# Patient Record
Sex: Male | Born: 1970 | Race: White | Hispanic: No | Marital: Married | Smoking: Former smoker
Health system: Southern US, Community
[De-identification: ages and names within clinical notes are randomized; demographics above are authoritative.]

## PROBLEM LIST (undated history)

## (undated) DIAGNOSIS — M109 Gout, unspecified: Secondary | ICD-10-CM

## (undated) DIAGNOSIS — K5792 Diverticulitis of intestine, part unspecified, without perforation or abscess without bleeding: Secondary | ICD-10-CM

## (undated) DIAGNOSIS — I10 Essential (primary) hypertension: Secondary | ICD-10-CM

## (undated) DIAGNOSIS — E119 Type 2 diabetes mellitus without complications: Secondary | ICD-10-CM

## (undated) DIAGNOSIS — K922 Gastrointestinal hemorrhage, unspecified: Secondary | ICD-10-CM

## (undated) HISTORY — PX: SHOULDER SURGERY: SHX246

## (undated) HISTORY — PX: JOINT REPLACEMENT: SHX530

---

## 2015-03-26 ENCOUNTER — Ambulatory Visit
Admission: EM | Admit: 2015-03-26 | Discharge: 2015-03-26 | Payer: Self-pay | Attending: Family Medicine | Admitting: Family Medicine

## 2015-03-26 ENCOUNTER — Encounter: Payer: Self-pay | Admitting: *Deleted

## 2015-03-26 ENCOUNTER — Inpatient Hospital Stay
Admission: EM | Admit: 2015-03-26 | Discharge: 2015-03-29 | DRG: 378 | Disposition: A | Discharge status: Home / Self Care | Payer: Self-pay | Attending: Internal Medicine | Admitting: Internal Medicine

## 2015-03-26 DIAGNOSIS — K922 Gastrointestinal hemorrhage, unspecified: Secondary | ICD-10-CM | POA: Diagnosis present

## 2015-03-26 DIAGNOSIS — Z79899 Other long term (current) drug therapy: Secondary | ICD-10-CM

## 2015-03-26 DIAGNOSIS — Z87891 Personal history of nicotine dependence: Secondary | ICD-10-CM

## 2015-03-26 DIAGNOSIS — D500 Iron deficiency anemia secondary to blood loss (chronic): Secondary | ICD-10-CM

## 2015-03-26 DIAGNOSIS — Z8719 Personal history of other diseases of the digestive system: Secondary | ICD-10-CM

## 2015-03-26 DIAGNOSIS — K625 Hemorrhage of anus and rectum: Secondary | ICD-10-CM

## 2015-03-26 DIAGNOSIS — D62 Acute posthemorrhagic anemia: Secondary | ICD-10-CM | POA: Diagnosis present

## 2015-03-26 DIAGNOSIS — E119 Type 2 diabetes mellitus without complications: Secondary | ICD-10-CM | POA: Diagnosis present

## 2015-03-26 DIAGNOSIS — I10 Essential (primary) hypertension: Secondary | ICD-10-CM | POA: Diagnosis present

## 2015-03-26 DIAGNOSIS — Z888 Allergy status to other drugs, medicaments and biological substances status: Secondary | ICD-10-CM

## 2015-03-26 DIAGNOSIS — K529 Noninfective gastroenteritis and colitis, unspecified: Secondary | ICD-10-CM | POA: Diagnosis present

## 2015-03-26 DIAGNOSIS — R55 Syncope and collapse: Secondary | ICD-10-CM

## 2015-03-26 DIAGNOSIS — K5791 Diverticulosis of intestine, part unspecified, without perforation or abscess with bleeding: Principal | ICD-10-CM | POA: Diagnosis present

## 2015-03-26 DIAGNOSIS — M109 Gout, unspecified: Secondary | ICD-10-CM | POA: Diagnosis present

## 2015-03-26 DIAGNOSIS — Z9660 Presence of unspecified orthopedic joint implant: Secondary | ICD-10-CM | POA: Diagnosis present

## 2015-03-26 DIAGNOSIS — R42 Dizziness and giddiness: Secondary | ICD-10-CM

## 2015-03-26 HISTORY — DX: Gout, unspecified: M10.9

## 2015-03-26 HISTORY — DX: Essential (primary) hypertension: I10

## 2015-03-26 HISTORY — DX: Gastrointestinal hemorrhage, unspecified: K92.2

## 2015-03-26 HISTORY — DX: Diverticulitis of intestine, part unspecified, without perforation or abscess without bleeding: K57.92

## 2015-03-26 HISTORY — DX: Type 2 diabetes mellitus without complications: E11.9

## 2015-03-26 LAB — CBC
HCT: 28.7 % — ABNORMAL LOW (ref 40.0–52.0)
Hemoglobin: 9.5 g/dL — ABNORMAL LOW (ref 13.0–18.0)
MCH: 26.1 pg (ref 26.0–34.0)
MCHC: 33.2 g/dL (ref 32.0–36.0)
MCV: 78.7 fL — ABNORMAL LOW (ref 80.0–100.0)
PLATELETS: 258 10*3/uL (ref 150–440)
RBC: 3.65 MIL/uL — ABNORMAL LOW (ref 4.40–5.90)
RDW: 13.9 % (ref 11.5–14.5)
WBC: 12.1 10*3/uL — AB (ref 3.8–10.6)

## 2015-03-26 LAB — CBC WITH DIFFERENTIAL/PLATELET
Basophils Absolute: 0.1 10*3/uL (ref 0–0.1)
Basophils Relative: 1 %
EOS PCT: 0 %
Eosinophils Absolute: 0 10*3/uL (ref 0–0.7)
HCT: 29 % — ABNORMAL LOW (ref 40.0–52.0)
Hemoglobin: 9.6 g/dL — ABNORMAL LOW (ref 13.0–18.0)
LYMPHS ABS: 2.2 10*3/uL (ref 1.0–3.6)
LYMPHS PCT: 18 %
MCH: 26.1 pg (ref 26.0–34.0)
MCHC: 33.2 g/dL (ref 32.0–36.0)
MCV: 78.5 fL — AB (ref 80.0–100.0)
MONO ABS: 0.8 10*3/uL (ref 0.2–1.0)
MONOS PCT: 7 %
Neutro Abs: 9.4 10*3/uL — ABNORMAL HIGH (ref 1.4–6.5)
Neutrophils Relative %: 74 %
PLATELETS: 267 10*3/uL (ref 150–440)
RBC: 3.69 MIL/uL — AB (ref 4.40–5.90)
RDW: 13.9 % (ref 11.5–14.5)
WBC: 12.5 10*3/uL — AB (ref 3.8–10.6)

## 2015-03-26 LAB — GLUCOSE, CAPILLARY
Glucose-Capillary: 264 mg/dL — ABNORMAL HIGH (ref 65–99)
Glucose-Capillary: 269 mg/dL — ABNORMAL HIGH (ref 65–99)

## 2015-03-26 LAB — COMPREHENSIVE METABOLIC PANEL
ALT: 27 U/L (ref 17–63)
ANION GAP: 10 (ref 5–15)
AST: 29 U/L (ref 15–41)
Albumin: 3.6 g/dL (ref 3.5–5.0)
Alkaline Phosphatase: 54 U/L (ref 38–126)
BUN: 17 mg/dL (ref 6–20)
CALCIUM: 8.3 mg/dL — AB (ref 8.9–10.3)
CHLORIDE: 99 mmol/L — AB (ref 101–111)
CO2: 22 mmol/L (ref 22–32)
CREATININE: 1.06 mg/dL (ref 0.61–1.24)
Glucose, Bld: 345 mg/dL — ABNORMAL HIGH (ref 65–99)
Potassium: 4.4 mmol/L (ref 3.5–5.1)
Sodium: 131 mmol/L — ABNORMAL LOW (ref 135–145)
Total Bilirubin: 0.3 mg/dL (ref 0.3–1.2)
Total Protein: 6.2 g/dL — ABNORMAL LOW (ref 6.5–8.1)

## 2015-03-26 LAB — ABO/RH: ABO/RH(D): A POS

## 2015-03-26 LAB — PREPARE RBC (CROSSMATCH)

## 2015-03-26 MED ORDER — ONDANSETRON HCL 4 MG PO TABS: 4.0000 mg | ORAL_TABLET | Freq: Four times a day (QID) | ORAL | Status: DC | PRN

## 2015-03-26 MED ORDER — INSULIN ASPART 100 UNIT/ML ~~LOC~~ SOLN
0.0000 [IU] | Freq: Three times a day (TID) | SUBCUTANEOUS | Status: DC
  Administered 2015-03-27 (×2): 2 [IU] via SUBCUTANEOUS
  Administered 2015-03-27: 3 [IU] via SUBCUTANEOUS
  Administered 2015-03-28 (×3): 2 [IU] via SUBCUTANEOUS
  Administered 2015-03-29 (×2): 3 [IU] via SUBCUTANEOUS
  Filled 2015-03-26 (×2): qty 2
  Filled 2015-03-26 (×2): qty 3
  Filled 2015-03-26 (×2): qty 2
  Filled 2015-03-26: qty 3
  Filled 2015-03-26: qty 2

## 2015-03-26 MED ORDER — SODIUM CHLORIDE 0.9 % IV SOLN
Freq: Once | INTRAVENOUS | Status: AC
  Administered 2015-03-26: 19:00:00 via INTRAVENOUS

## 2015-03-26 MED ORDER — ACETAMINOPHEN 325 MG PO TABS
650.0000 mg | ORAL_TABLET | Freq: Four times a day (QID) | ORAL | Status: DC | PRN
  Administered 2015-03-28: 650 mg via ORAL
  Filled 2015-03-26: qty 2

## 2015-03-26 MED ORDER — SODIUM CHLORIDE 0.9 % IJ SOLN
3.0000 mL | Freq: Two times a day (BID) | INTRAMUSCULAR | Status: DC
  Administered 2015-03-26 – 2015-03-27 (×2): 3 mL via INTRAVENOUS

## 2015-03-26 MED ORDER — METOPROLOL TARTRATE 50 MG PO TABS
50.0000 mg | ORAL_TABLET | Freq: Two times a day (BID) | ORAL | Status: DC
  Administered 2015-03-26 – 2015-03-27 (×2): 50 mg via ORAL
  Filled 2015-03-26 (×3): qty 1

## 2015-03-26 MED ORDER — PANTOPRAZOLE SODIUM 40 MG IV SOLR
40.0000 mg | Freq: Once | INTRAVENOUS | Status: AC
  Administered 2015-03-26: 40 mg via INTRAVENOUS
  Filled 2015-03-26: qty 40

## 2015-03-26 MED ORDER — SODIUM CHLORIDE 0.9 % IV SOLN: Freq: Once | INTRAVENOUS | Status: DC

## 2015-03-26 MED ORDER — PANTOPRAZOLE SODIUM 40 MG IV SOLR
80.0000 mg | Freq: Once | INTRAVENOUS | Status: AC
  Administered 2015-03-26: 80 mg via INTRAVENOUS
  Filled 2015-03-26: qty 80

## 2015-03-26 MED ORDER — ACETAMINOPHEN 650 MG RE SUPP
650.0000 mg | Freq: Four times a day (QID) | RECTAL | Status: DC | PRN
Start: 2015-03-26 — End: 2015-03-29

## 2015-03-26 MED ORDER — OXYCODONE HCL 5 MG PO TABS
5.0000 mg | ORAL_TABLET | ORAL | Status: DC | PRN
  Administered 2015-03-26 – 2015-03-28 (×7): 5 mg via ORAL
  Filled 2015-03-26 (×8): qty 1

## 2015-03-26 MED ORDER — SODIUM CHLORIDE 0.9 % IV SOLN
8.0000 mg/h | INTRAVENOUS | Status: DC
  Administered 2015-03-26 – 2015-03-28 (×4): 8 mg/h via INTRAVENOUS
  Filled 2015-03-26 (×4): qty 80

## 2015-03-26 MED ORDER — CITALOPRAM HYDROBROMIDE 20 MG PO TABS
40.0000 mg | ORAL_TABLET | Freq: Every day | ORAL | Status: DC
  Administered 2015-03-27 – 2015-03-28 (×2): 40 mg via ORAL
  Filled 2015-03-26 (×4): qty 2

## 2015-03-26 MED ORDER — ONDANSETRON HCL 4 MG/2ML IJ SOLN
4.0000 mg | Freq: Four times a day (QID) | INTRAMUSCULAR | Status: DC | PRN
  Administered 2015-03-26: 4 mg via INTRAVENOUS
  Filled 2015-03-26: qty 2

## 2015-03-26 MED ORDER — ALLOPURINOL 300 MG PO TABS
300.0000 mg | ORAL_TABLET | Freq: Every day | ORAL | Status: DC
  Administered 2015-03-26 – 2015-03-29 (×4): 300 mg via ORAL
  Filled 2015-03-26 (×5): qty 1

## 2015-03-26 MED ORDER — PANTOPRAZOLE SODIUM 40 MG IV SOLR
40.0000 mg | Freq: Two times a day (BID) | INTRAVENOUS | Status: DC
Start: 2015-03-30 — End: 2015-03-28

## 2015-03-26 MED ORDER — ZOLPIDEM TARTRATE 5 MG PO TABS
10.0000 mg | ORAL_TABLET | Freq: Every evening | ORAL | Status: DC | PRN
  Administered 2015-03-26 – 2015-03-28 (×3): 10 mg via ORAL
  Filled 2015-03-26 (×3): qty 2

## 2015-03-26 MED ORDER — SODIUM CHLORIDE 0.9 % IV SOLN
INTRAVENOUS | Status: DC
  Administered 2015-03-26 – 2015-03-28 (×4): via INTRAVENOUS

## 2015-03-26 NOTE — ED Provider Notes (Signed)
[facility] Emergency Department Provider Note    ____________________________________________  Time seen: 1635  I have reviewed the triage vital signs and the nursing notes.   HISTORY  Chief Complaint Rectal Bleeding   History limited by: Not Limited   HPI Timothy Bartlett is a 45 y.o. male with history of GI bleeds who presents to the emergency department today because of concerns for further GI bleeding. The patient states that it started last night. He has had multiple episodes of GI bleeding. He describes it as being severe. He describes bright red color. He states that he has had a large volume. He has associated abdominal pain. Initially was in the left upper quadrant. He states that he has had similar symptoms in the past. He has required transfusions in ICU admissions in the past. He states he has undergone extensive GI workup without an obvious source of the bleeding. He has had some dizziness today.   Past Medical History  Diagnosis Date  . Diabetes mellitus without complication (HCC)   . Hypertension   . Gout   . GI bleed     There are no active problems to display for this patient.   Past Surgical History  Procedure Laterality Date  . Joint replacement    . Shoulder surgery      Current Outpatient Rx  Name  Route  Sig  Dispense  Refill  . allopurinol (ZYLOPRIM) 300 MG tablet   Oral   Take 300 mg by mouth daily.         . citalopram (CELEXA) 40 MG tablet   Oral   Take 40 mg by mouth daily.         Marland Kitchen glipiZIDE (GLUCOTROL XL) 5 MG 24 hr tablet   Oral   Take 5 mg by mouth daily with breakfast.         . metFORMIN (GLUMETZA) 1000 MG (MOD) 24 hr tablet   Oral   Take 1,000 mg by mouth 2 (two) times daily with a meal.         . metoprolol (LOPRESSOR) 50 MG tablet   Oral   Take 50 mg by mouth.         . sulfamethoxazole-trimethoprim (BACTRIM DS,SEPTRA DS) 800-160 MG tablet   Oral   Take 1 tablet by mouth 2 (two)  times daily.         . temazepam (RESTORIL) 30 MG capsule   Oral   Take 30 mg by mouth at bedtime as needed for sleep.         Marland Kitchen zolpidem (AMBIEN) 10 MG tablet   Oral   Take 10 mg by mouth at bedtime as needed for sleep.           Allergies Toradol  History reviewed. No pertinent family history.  Social History Social History  Substance Use Topics  . Smoking status: Former Games developer  . Smokeless tobacco: None  . Alcohol Use: Yes     Comment: rarely    Review of Systems  Constitutional: Negative for fever. Cardiovascular: Negative for chest pain. Respiratory: Negative for shortness of breath. Gastrointestinal: Positive for GI bleeds, left upper quadrant abdominal pain Neurological: Negative for headaches, focal weakness or numbness.   10-point ROS otherwise negative.  ____________________________________________   PHYSICAL EXAM:  VITAL SIGNS:   97.9 F (36.6 C)  79  18   108/50 mmHg  100 %     Constitutional: Alert and oriented. Well appearing and in no distress. Eyes:  Conjunctivae are normal. PERRL. Normal extraocular movements. ENT   Head: Normocephalic and atraumatic.   Nose: No congestion/rhinnorhea.   Mouth/Throat: Mucous membranes are moist.   Neck: No stridor. Hematological/Lymphatic/Immunilogical: No cervical lymphadenopathy. Cardiovascular: Normal rate, regular rhythm.  No murmurs, rubs, or gallops. Respiratory: Normal respiratory effort without tachypnea nor retractions. Breath sounds are clear and equal bilaterally. No wheezes/rales/rhonchi. Gastrointestinal: Soft and nontender. No distention. There is no CVA tenderness. Genitourinary: Deferred Musculoskeletal: Normal range of motion in all extremities. No joint effusions.  No lower extremity tenderness nor edema. Neurologic:  Normal speech and language. No gross focal neurologic deficits are appreciated.  Skin:  Skin is warm, dry and intact. No rash noted. Psychiatric: Mood and  affect are normal. Speech and behavior are normal. Patient exhibits appropriate insight and judgment.  ____________________________________________    LABS (pertinent positives/negatives)  Labs Reviewed  CBC - Abnormal; Notable for the following:    WBC 12.1 (*)    RBC 3.65 (*)    Hemoglobin 9.5 (*)    HCT 28.7 (*)    MCV 78.7 (*)    All other components within normal limits  TYPE AND SCREEN  PREPARE RBC (CROSSMATCH)  ABO/RH     ____________________________________________   EKG  None  ____________________________________________    RADIOLOGY  None   ____________________________________________   PROCEDURES  Procedure(s) performed: None  Critical Care performed: No  ____________________________________________   INITIAL IMPRESSION / ASSESSMENT AND PLAN / ED COURSE  Pertinent labs & imaging results that were available during my care of the patient were reviewed by me and considered in my medical decision making (see chart for details).  Patient presented to the emergency department today because of concerns for GI bleeds. The patient has a history of severe GI bleeds in the past requiring transfusions and ICU care. On exam the patient appears well. He did have a bloody stool 200 mL here in the emergency department. Hemoglobin 9.5. I did order a type and cross and discussed blood transfusions with the patient. He will be admitted to the hospital service for further management.  ____________________________________________   FINAL CLINICAL IMPRESSION(S) / ED DIAGNOSES  Final diagnoses:  Gastrointestinal hemorrhage, unspecified gastritis, unspecified gastrointestinal hemorrhage type     Phineas SemenGraydon Jazion Atteberry, MD 03/26/15 1752

## 2015-03-26 NOTE — ED Notes (Signed)
MD Goodman at bedside. 

## 2015-03-26 NOTE — H&P (Signed)
[facility] [place] [facility]   PATIENT NAME: Timothy Bartlett    MR#:  [id number]  DATE OF BIRTH:  29-Jul-1970  DATE OF ADMISSION:  03/26/2015  PRIMARY CARE PHYSICIAN:  None Local  REQUESTING/REFERRING PHYSICIAN:  Phineas SemenGraydon Goodman MD  CHIEF COMPLAINT:   Chief Complaint  Patient presents with  . Rectal Bleeding    HISTORY OF PRESENT ILLNESS: Timothy Barrackimothy Gerke  is a 45 y.o. male with a known history of  GI bleed in the past, diabetes 2, gout presents with bright red blood per rectum and passing clots. Patient reports that he had similar type presentation 4-6 years ago during that time he had the evaluation including an EGD and a colonoscopy which showed multiple polyps as well as diverticulosis. He had a capsule endoscopy which was incomplete. Patient since yesterday has had more than 15 episodes of  bloody bowel movements with bright red blood mixed with dark colored blood.. He had a large episode in the ER. He was feeling dizzy earlier. Denies any chest pain or shortness of breath currently. Feels very weak. 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 PAST MEDICAL HISTORY:   Past Medical History  Diagnosis Date  . Diabetes mellitus without complication (HCC)   . Hypertension   . Gout   . GI bleed     PAST SURGICAL HISTORY: Past Surgical History  Procedure Laterality Date  . Joint replacement    . Shoulder surgery      SOCIAL HISTORY:  Social History  Substance Use Topics  . Smoking status: Former Games developermoker  . Smokeless tobacco: Not on file  . Alcohol Use: 0.0 oz/week    0 Standard drinks or equivalent per week     Comment: rarely    FAMILY HISTORY:  Family History  Problem Relation Age of Onset  . GI Bleed      DRUG ALLERGIES:  Allergies  Allergen Reactions  . Toradol [Ketorolac Tromethamine] Swelling    REVIEW OF SYSTEMS:   CONSTITUTIONAL: No fever, positive fatigue and weakness.  EYES: No blurred  or double vision.  EARS, NOSE, AND THROAT: No tinnitus or ear pain.  RESPIRATORY: No cough, shortness of breath, wheezing or hemoptysis.  CARDIOVASCULAR: No chest pain, orthopnea, edema.  GASTROINTESTINAL: No nausea, vomiting, diarrhea or abdominal pain. Bright red blood per rectum GENITOURINARY: No dysuria, hematuria.  ENDOCRINE: No polyuria, nocturia,  HEMATOLOGY: No anemia, easy bruising or bleeding SKIN: No rash or lesion. MUSCULOSKELETAL: No joint pain or arthritis.   NEUROLOGIC: No tingling, numbness, weakness.  PSYCHIATRY: No anxiety or depression.   MEDICATIONS AT HOME:  Prior to Admission medications   Medication Sig Start Date End Date Taking? Authorizing Provider  allopurinol (ZYLOPRIM) 300 MG tablet Take 300 mg by mouth daily.    Historical Provider, MD  citalopram (CELEXA) 40 MG tablet Take 40 mg by mouth daily.    Historical Provider, MD  glipiZIDE (GLUCOTROL XL) 5 MG 24 hr tablet Take 5 mg by mouth daily with breakfast.    Historical Provider, MD  metFORMIN (GLUMETZA) 1000 MG (MOD) 24 hr tablet Take 1,000 mg by mouth 2 (two) times daily with a meal.    Historical Provider, MD  metoprolol (LOPRESSOR) 50 MG tablet Take 50 mg by mouth.    Historical Provider, MD  sulfamethoxazole-trimethoprim (BACTRIM DS,SEPTRA DS) 800-160 MG tablet Take 1 tablet by mouth 2 (two) times daily.    Historical Provider, MD  temazepam (RESTORIL) 30 MG  capsule Take 30 mg by mouth at bedtime as needed for sleep.    Historical Provider, MD  zolpidem (AMBIEN) 10 MG tablet Take 10 mg by mouth at bedtime as needed for sleep.    Historical Provider, MD      PHYSICAL EXAMINATION:   VITAL SIGNS: Blood pressure 108/50, pulse 79, temperature 97.9 F (36.6 C), temperature source Oral, resp. rate 18, height 5\' 8"  (1.727 m), weight 127.007 kg (280 lb), SpO2 100 %.  GENERAL:  45 y.o.-year-old patient lying in the bed with no acute distress.  EYES: Pupils equal, round, reactive to light and accommodation. No  scleral icterus. Extraocular muscles intact.  HEENT: Head atraumatic, normocephalic. Oropharynx and nasopharynx clear.  NECK:  Supple, no jugular venous distention. No thyroid enlargement, no tenderness.  LUNGS: Normal breath sounds bilaterally, no wheezing, rales,rhonchi or crepitation. No use of accessory muscles of respiration.  CARDIOVASCULAR: S1, S2 normal. No murmurs, rubs, or gallops.  ABDOMEN: Soft, nontender, nondistended. Bowel sounds present. No organomegaly or mass.  EXTREMITIES: No pedal edema, cyanosis, or clubbing.  NEUROLOGIC: Cranial nerves II through XII are intact. Muscle strength 5/5 in all extremities. Sensation intact. Gait not checked.  PSYCHIATRIC: The patient is alert and oriented x 3.  SKIN: No obvious rash, lesion, or ulcer.   LABORATORY PANEL:   CBC  Recent Labs Lab 03/26/15 1538 03/26/15 1626  WBC 12.5* 12.1*  HGB 9.6* 9.5*  HCT 29.0* 28.7*  PLT 267 258  MCV 78.5* 78.7*  MCH 26.1 26.1  MCHC 33.2 33.2  RDW 13.9 13.9  LYMPHSABS 2.2  --   MONOABS 0.8  --   EOSABS 0.0  --   BASOSABS 0.1  --    ------------------------------------------------------------------------------------------------------------------  Chemistries   Recent Labs Lab 03/26/15 1538  NA 131*  K 4.4  CL 99*  CO2 22  GLUCOSE 345*  BUN 17  CREATININE 1.06  CALCIUM 8.3*  AST 29  ALT 27  ALKPHOS 54  BILITOT 0.3   ------------------------------------------------------------------------------------------------------------------ estimated creatinine clearance is 113.1 mL/min (by C-G formula based on Cr of 1.06). ------------------------------------------------------------------------------------------------------------------ No results for input(s): TSH, T4TOTAL, T3FREE, THYROIDAB in the last 72 hours.  Invalid input(s): FREET3   Coagulation profile No results for input(s): INR, PROTIME in the last 168  hours. ------------------------------------------------------------------------------------------------------------------- No results for input(s): DDIMER in the last 72 hours. -------------------------------------------------------------------------------------------------------------------  Cardiac Enzymes No results for input(s): CKMB, TROPONINI, MYOGLOBIN in the last 168 hours.  Invalid input(s): CK ------------------------------------------------------------------------------------------------------------------ Invalid input(s): POCBNP  ---------------------------------------------------------------------------------------------------------------  Urinalysis No results found for: COLORURINE, APPEARANCEUR, LABSPEC, PHURINE, GLUCOSEU, HGBUR, BILIRUBINUR, KETONESUR, PROTEINUR, UROBILINOGEN, NITRITE, LEUKOCYTESUR   RADIOLOGY: No results found.  EKG: No orders found for this or any previous visit.  IMPRESSION AND PLAN: Patient is a 45 year old white male presents with GI bleed  1. GI bleed I suspect is likely lower in nature however he also describes dark-colored stools. I will start him on Protonix drip. Due to significant amount of bleeding and symptomatic I will transfuse him. Because he has had multiple large bloody bowel movements. Also if he has a recurrence of the bleed we will obtain a bleeding scan stat. Patient consented to transfusion risk and benefits explained he is agreeable I will also has GI to see the patient.  2. Hypertension: Continue metoprolol  3. Diabetes type 2: I'll place him on sliding scale insulin hold metformin for time being    All the records are reviewed and case discussed with ED provider. Management plans discussed with the patient, family  and they are in agreement.  CODE STATUS: Full    TOTAL TIME TAKING CARE OF THIS PATIENT:55 minutes.    Auburn Bilberry M.D on 03/26/2015 at 5:56 PM  Between 7am to 6pm - Pager - [phone]  After  6pm go to [email or web address] - password [email or web address] [facility]  [facility]  Office  [phone]  CC: Primary care physician; No primary care provider on file.

## 2015-03-26 NOTE — ED Notes (Addendum)
Started yesterday afternoon 4:30pm with sharp twisting pain under left ribcage. 2 hours later sudden bright red rectal bleeding.  Has had 6 bouts of bright red blood in toilet. Color pale, skin cool and dry. States previous hx of 2 GI bleeds. Currently c/o LUQ and low abdominal pain

## 2015-03-26 NOTE — ED Provider Notes (Signed)
CSN: [id number]     Arrival date & time 03/26/15  1506 History   None    Chief Complaint  Patient presents with  . 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 States has had about 6 episodes of copious amount of blood per rectum since last night, associated with left quadrant abdominal pain and dizziness. Last night felt like he was going to faint.  Denies any vomiting, fevers, chills, chest pains States this episode is worse than his previous 3 episodes of GI bleed he's had in the past. With his previous episodes in [place], he's been hospitalized, in ICU, transfused and GI work ups have been negative per patient.   Patient is a 45 y.o. male presenting with hematochezia. The history is provided by the patient.  Rectal Bleeding Quality:  Bright red Amount:  Copious Duration:  1 day (symptoms) Timing:  Intermittent Progression:  Worsening Chronicity:  New Context: spontaneously   Associated symptoms: abdominal pain (left quadrant), dizziness and light-headedness   Associated symptoms: no epistaxis, no fever and no hematemesis   Risk factors: no anticoagulant use     Past Medical History  Diagnosis Date  . Diabetes mellitus without complication (HCC)   . Hypertension   . Gout   . GI bleed    Past Surgical History  Procedure Laterality Date  . Joint replacement    . Shoulder surgery     No family history on file. Social History  Substance Use Topics  . Smoking status: Former Games developer  . Smokeless tobacco: None  . Alcohol Use: Yes     Comment: rarely    Review of Systems  Constitutional: Negative for fever.  HENT: Negative for nosebleeds.   Gastrointestinal: Positive for abdominal pain (left quadrant) and hematochezia. Negative for hematemesis.  Neurological: Positive for dizziness and  light-headedness.    Allergies  Toradol  Home Medications   Prior to Admission medications   Medication Sig Start Date End Date Taking? Authorizing Provider  allopurinol (ZYLOPRIM) 300 MG tablet Take 300 mg by mouth daily.   Yes Historical Provider, MD  citalopram (CELEXA) 40 MG tablet Take 40 mg by mouth daily.   Yes Historical Provider, MD  glipiZIDE (GLUCOTROL XL) 5 MG 24 hr tablet Take 5 mg by mouth daily with breakfast.   Yes Historical Provider, MD  metFORMIN (GLUMETZA) 1000 MG (MOD) 24 hr tablet Take 1,000 mg by mouth 2 (two) times daily with a meal.   Yes Historical Provider, MD  metoprolol (LOPRESSOR) 50 MG tablet Take 50 mg by mouth.   Yes Historical Provider, MD  sulfamethoxazole-trimethoprim (BACTRIM DS,SEPTRA DS) 800-160 MG tablet Take 1 tablet by mouth 2 (two) times daily.   Yes Historical Provider, MD  temazepam (RESTORIL) 30 MG capsule Take 30 mg by mouth at bedtime as needed for sleep.   Yes Historical Provider, MD  zolpidem (AMBIEN) 10 MG tablet Take 10 mg by mouth at bedtime as needed for sleep.   Yes Historical Provider, MD   Meds Ordered and Administered this Visit   Medications  0.9 %  sodium chloride infusion (not administered)    BP 124/72 mmHg  Pulse 88  Temp(Src) 95.9 F (35.5 C) (Tympanic)  Resp 18  Ht 5\' 8"  (1.727 m)  Wt 280 lb (127.007 kg)  BMI 42.58 kg/m2  SpO2 100% No data found.   Physical Exam  Constitutional: He is oriented to person, place, and time. He appears well-developed and well-nourished. No distress.  HENT:  Head: Normocephalic and atraumatic.  Cardiovascular: Normal rate, regular rhythm, normal heart sounds and intact distal pulses.   No murmur heard. Pulmonary/Chest: Effort normal and breath sounds normal. No respiratory distress. He has no wheezes. He has no rales.  Abdominal: Soft. Bowel sounds are normal. He exhibits no distension and no mass. There is tenderness (left upper quadrant). There is no rebound and no guarding.   obese  Neurological: He is alert and oriented to person, place, and time.  Skin: No rash noted. He is not diaphoretic.  Nursing note and vitals reviewed.   ED Course  Procedures (including critical care time)  Labs Review Labs Reviewed  CBC WITH DIFFERENTIAL/PLATELET - Abnormal; Notable for the following:    WBC 12.5 (*)    RBC 3.69 (*)    Hemoglobin 9.6 (*)    HCT 29.0 (*)    MCV 78.5 (*)    Neutro Abs 9.4 (*)    All other components within normal limits  COMPREHENSIVE METABOLIC PANEL - Abnormal; Notable for the following:    Sodium 131 (*)    Chloride 99 (*)    Glucose, Bld 345 (*)    Calcium 8.3 (*)    Total Protein 6.2 (*)    All other components within normal limits    Imaging Review No results found.   Visual Acuity Review  Right Eye Distance:   Left Eye Distance:   Bilateral Distance:    Right Eye Near:   Left Eye Near:    Bilateral Near:         MDM   1. Rectal bleeding   2. Pre-syncope   3. Dizziness   4. History of GI bleed   ( 4 times with previous ICU admissions and blood transfusions as well as unknown etiology with work ups)    Discussed with patient and wife due to his current symptoms of hematochezia and dizziness as well as h/o multiple GI bleed episodes of unknown etiology (last episode 4 years ago; ICU admission in [place]); would recommend he go to ED by EMS for further evaluation and management. IVF started, preliminary blood tests ordered. Patient left our facility by EMS in stable condition. Consulting civil engineerCharge RN at [facility] ED notified by me.      Payton Mccallumrlando Ennio Houp, MD 03/26/15 [phone]

## 2015-03-27 ENCOUNTER — Inpatient Hospital Stay: Payer: Self-pay

## 2015-03-27 LAB — CBC
HCT: 21.2 % — ABNORMAL LOW (ref 40.0–52.0)
HEMATOCRIT: 23.1 % — AB (ref 40.0–52.0)
HEMATOCRIT: 29.1 % — AB (ref 40.0–52.0)
HEMOGLOBIN: 7.9 g/dL — AB (ref 13.0–18.0)
HEMOGLOBIN: 9.7 g/dL — AB (ref 13.0–18.0)
Hemoglobin: 7.1 g/dL — ABNORMAL LOW (ref 13.0–18.0)
MCH: 26.7 pg (ref 26.0–34.0)
MCH: 26.1 pg (ref 26.0–34.0)
MCH: 26.6 pg (ref 26.0–34.0)
MCHC: 34.1 g/dL (ref 32.0–36.0)
MCHC: 33.7 g/dL (ref 32.0–36.0)
MCHC: 33.3 g/dL (ref 32.0–36.0)
MCV: 78.3 fL — AB (ref 80.0–100.0)
MCV: 77.5 fL — AB (ref 80.0–100.0)
MCV: 79.8 fL — ABNORMAL LOW (ref 80.0–100.0)
PLATELETS: 193 10*3/uL (ref 150–440)
Platelets: 183 10*3/uL (ref 150–440)
Platelets: 211 10*3/uL (ref 150–440)
RBC: 2.95 MIL/uL — ABNORMAL LOW (ref 4.40–5.90)
RBC: 2.74 MIL/uL — AB (ref 4.40–5.90)
RBC: 3.65 MIL/uL — AB (ref 4.40–5.90)
RDW: 13.7 % (ref 11.5–14.5)
RDW: 14.5 % (ref 11.5–14.5)
RDW: 14.7 % — ABNORMAL HIGH (ref 11.5–14.5)
WBC: 7.7 10*3/uL (ref 3.8–10.6)
WBC: 8.7 10*3/uL (ref 3.8–10.6)
WBC: 9.6 10*3/uL (ref 3.8–10.6)

## 2015-03-27 LAB — BASIC METABOLIC PANEL
ANION GAP: 3 — AB (ref 5–15)
BUN: 14 mg/dL (ref 6–20)
CALCIUM: 7.6 mg/dL — AB (ref 8.9–10.3)
CO2: 26 mmol/L (ref 22–32)
Chloride: 107 mmol/L (ref 101–111)
Creatinine, Ser: 0.86 mg/dL (ref 0.61–1.24)
GFR calc Af Amer: >60 mL/min (ref >60)
GFR calc non Af Amer: >60 mL/min (ref >60)
GLUCOSE: 185 mg/dL — AB (ref 65–99)
Potassium: 3.8 mmol/L (ref 3.5–5.1)
Sodium: 136 mmol/L (ref 135–145)

## 2015-03-27 LAB — GLUCOSE, CAPILLARY
GLUCOSE-CAPILLARY: 230 mg/dL — AB (ref 65–99)
GLUCOSE-CAPILLARY: 190 mg/dL — AB (ref 65–99)
GLUCOSE-CAPILLARY: 252 mg/dL — AB (ref 65–99)
Glucose-Capillary: 182 mg/dL — ABNORMAL HIGH (ref 65–99)

## 2015-03-27 LAB — PREPARE RBC (CROSSMATCH)

## 2015-03-27 MED ORDER — TECHNETIUM TC 99M-LABELED RED BLOOD CELLS IV KIT: 20.0000 | PACK | Freq: Once | INTRAVENOUS | Status: DC | PRN

## 2015-03-27 MED ORDER — SODIUM CHLORIDE 0.9 % IV SOLN
Freq: Once | INTRAVENOUS | Status: AC
Start: 2015-03-27 — End: 2015-03-27
  Administered 2015-03-27: 13:00:00 via INTRAVENOUS

## 2015-03-27 MED ORDER — TECHNETIUM TC 99M-LABELED RED BLOOD CELLS IV KIT
20.0000 | PACK | Freq: Once | INTRAVENOUS | Status: AC | PRN
  Administered 2015-03-27: 19.77 via INTRAVENOUS

## 2015-03-27 NOTE — Progress Notes (Signed)
[facility] [place] [facility]   PATIENT NAME: Timothy Bartlett    MR#:  [id number]  DATE OF BIRTH:  [birth year]  SUBJECTIVE:  CHIEF COMPLAINT:   Chief Complaint  Patient presents with  . Rectal Bleeding   States he continues to pass red blood. Nurse confirms. Still feels very dizzy when standing  REVIEW OF SYSTEMS:   Review of Systems  Constitutional: Positive for malaise/fatigue. Negative for fever.  Respiratory: Negative for shortness of breath.   Cardiovascular: Negative for chest pain and palpitations.  Gastrointestinal: Positive for abdominal pain and blood in stool. Negative for nausea and vomiting.  Genitourinary: Negative for dysuria.  Neurological: Positive for dizziness and weakness.    DRUG ALLERGIES:   Allergies  Allergen Reactions  . Toradol [Ketorolac Tromethamine] Swelling    VITALS:  Blood pressure 127/52, pulse 74, temperature 98 F (36.7 C), temperature source Oral, resp. rate 18, height 5\' 8"  (1.727 m), weight 127.007 kg (280 lb), SpO2 97 %.  PHYSICAL EXAMINATION:  GENERAL:  45 y.o.-year-old patient lying in the bed with no acute distress. Pale EYES: Pupils equal, round, reactive to light and accommodation. No scleral icterus. Extraocular muscles intact.  HEENT: Head atraumatic, normocephalic. Oropharynx and nasopharynx clear.  NECK:  Supple, no jugular venous distention. No thyroid enlargement, no tenderness.  LUNGS: Normal breath sounds bilaterally, no wheezing, rales,rhonchi or crepitation. No use of accessory muscles of respiration.  CARDIOVASCULAR: S1, S2 normal. No murmurs, rubs, or gallops.  ABDOMEN: Soft, slightly, nondistended. Bowel sounds present. No organomegaly or mass.  EXTREMITIES: No pedal edema, cyanosis, or clubbing. Pulses 2+ NEUROLOGIC: Cranial nerves II through XII are intact. Muscle strength 5/5 in all extremities. Sensation intact. Gait not checked.  PSYCHIATRIC: The patient is alert and oriented x  3. Anxious SKIN: No obvious rash, lesion, or ulcer.    LABORATORY PANEL:   CBC  Recent Labs Lab 03/27/15 1254  WBC 8.7  HGB 7.1*  HCT 21.2*  PLT 193   ------------------------------------------------------------------------------------------------------------------  Chemistries   Recent Labs Lab 03/26/15 1538 03/27/15 0526  NA 131* 136  K 4.4 3.8  CL 99* 107  CO2 22 26  GLUCOSE 345* 185*  BUN 17 14  CREATININE 1.06 0.86  CALCIUM 8.3* 7.6*  AST 29  --   ALT 27  --   ALKPHOS 54  --   BILITOT 0.3  --    ------------------------------------------------------------------------------------------------------------------  Cardiac Enzymes No results for input(s): TROPONINI in the last 168 hours. ------------------------------------------------------------------------------------------------------------------  RADIOLOGY:  No results found.  EKG:  No orders found for this or any previous visit.  ASSESSMENT AND PLAN:   1. Acute GI bleeding - Have ordered tag red blood cell scan as he continues to pass blood - Gastroenterology consultation is pending. The patient states he has had EGD and colonoscopy in the past with no source of bleeding found. - Continue Protonix twice a day, nothing by mouth  2. Acute posthemorrhagic anemia - Presenting hemoglobin 9.6. 7.1 now. Type and screen and transfuse 2 units. Stay II units ahead. - Blood pressure stable on the not tachycardic  3. Hypertension - blood pressure controlled, though low. I will hold antihypertensives in case of further bleeding  4. Diabetes mellitus - Patient nothing by mouth. Continue sliding scale  All the records are reviewed and case discussed with Care Management/Social Workerr. Management plans discussed with the patient, family and they are in agreement. Care plan discussed with the patient and his wife at the bedside  CODE STATUS: Full  TOTAL TIME TAKING CARE OF THIS PATIENT: 35 minutes.   Greater than 50% of time spent in care coordination and counseling. POSSIBLE D/C IN 3 DAYS, DEPENDING ON CLINICAL CONDITION.   Elby Showers M.D on 03/27/2015 at 5:01 PM  Between 7am to 6pm - Pager - [phone]  After 6pm go to [email or web address] - password [email or web address] [facility]  [place] [facility]  Office  [phone]  CC: Primary care physician; No primary care provider on file.

## 2015-03-27 NOTE — Consult Note (Signed)
GI Inpatient Consult Note  Reason for Consult: Rectal Bleeding   Attending Requesting Consult: Dr. Allena Katz  History of Present Illness: Timothy Bartlett is a 45 y.o. male who has a pmh of diabetes, htn, previous GI bleeds (2 in past 4 years)  Reports he has had to be hospitalized with transfusions for both previous episodes of rectal bleeding.  We were able to obtain [facility] records from his previous hospitalization.  He had an upper endoscopy completed on April 14, 2010, impression, normal upper endoscopy.  He had a colonoscopy completed the same day, impression the terminal ileum was normal.  No AV malformation or strictures.  The colon displayed no AV malformation but moderate diverticulosis was noted.  A small bowel capsule endoscopy was completed impression; one single arteriovenous malformation, capsule malfunction, incomplete capsular enteroscopy.  He reports this episode started Tuesday, had the urge to have a BM and it was just a large amount of BRB.  He had another episode within 30 minutes with the same result.  He did start to feel dizzy at this point.  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  This time he just used a 5 gal bucket, because he was not able to make it to the bathroom.    The next morning he was feeling better until lunch, when he had another episode and felt dizzy again.  He then presented to the ED.  He does report that prior to this episode he was out working on his truck, and bent over and felt like his "insides were twisting"  He reports it continued to feel "achy" and then within a few hours he started bleeding.  He reports that he take Bactrim BID on a daily basis.  He reports that he was prescribed this to help with chronic diarrhea.  Past Medical History:  Past Medical History  Diagnosis Date  . Diabetes mellitus without complication  (HCC)   . Hypertension   . Gout   . GI bleed   . Diverticulitis     Problem List: Patient Active Problem List   Diagnosis Date Noted  . GI bleed 03/26/2015    Past Surgical History: Past Surgical History  Procedure Laterality Date  . Joint replacement    . Shoulder surgery      Allergies: Allergies  Allergen Reactions  . Toradol [Ketorolac Tromethamine] Swelling    Home Medications: Prescriptions prior to admission  Medication Sig Dispense Refill Last Dose  . allopurinol (ZYLOPRIM) 300 MG tablet Take 300 mg by mouth daily.   03/25/2015 at Unknown time  . citalopram (CELEXA) 40 MG tablet Take 40 mg by mouth daily.   03/25/2015 at Unknown time  . glipiZIDE (GLUCOTROL) 5 MG tablet Take 5 mg by mouth 2 (two) times daily before a meal.   03/25/2015 at Unknown time  . metFORMIN (GLUMETZA) 1000 MG (MOD) 24 hr tablet Take 1,000 mg by mouth 2 (two) times daily with a meal.   03/25/2015 at Unknown time  . metoprolol (LOPRESSOR) 50 MG tablet Take 50 mg by mouth 2 (two) times daily.    03/24/2015 at unknown  . sulfamethoxazole-trimethoprim (BACTRIM DS,SEPTRA DS) 800-160 MG tablet Take 1 tablet by mouth 2 (two) times daily.   03/25/2015 at Unknown time  . temazepam (RESTORIL) 30 MG capsule Take 30 mg by mouth at bedtime  as needed for sleep.   03/25/2015 at Unknown time  . zolpidem (AMBIEN) 10 MG tablet Take 10 mg by mouth at bedtime as needed for sleep.   prn at prn   Home medication reconciliation was completed with the patient.   Scheduled Inpatient Medications:   . sodium chloride   Intravenous Once  . allopurinol  300 mg Oral Daily  . citalopram  40 mg Oral Daily  . insulin aspart  0-9 Units Subcutaneous TID WC  . metoprolol  50 mg Oral BID  . [START ON 03/30/2015] pantoprazole (PROTONIX) IV  40 mg Intravenous Q12H  . sodium chloride  3 mL Intravenous Q12H    Continuous Inpatient Infusions:   . sodium chloride 125 mL/hr at 03/27/15 0504  . pantoprozole (PROTONIX) infusion 8 mg/hr (03/27/15  0754)    PRN Inpatient Medications:  acetaminophen **OR** acetaminophen, ondansetron **OR** ondansetron (ZOFRAN) IV, oxyCODONE, technetium labeled red blood cells, zolpidem  Family History: family history is not on file.    Social History:   reports that he has quit smoking. He does not have any smokeless tobacco history on file. He reports that he drinks alcohol. He reports that he does not use illicit drugs.   Review of Systems: Constitutional: Weight is stable.  Eyes: No changes in vision. ENT: No oral lesions, sore throat.  GI: see HPI.  Heme/Lymph: No easy bruising.  CV: No chest pain.  GU: No hematuria.  Integumentary: No rashes.  Neuro: No headaches.  Psych: No depression/anxiety.  Endocrine: No heat/cold intolerance.  Allergic/Immunologic: No urticaria.  Resp: No cough, SOB.  Musculoskeletal: No joint swelling.    Physical Examination: BP 127/52 mmHg  Pulse 74  Temp(Src) 98 F (36.7 C) (Oral)  Resp 18  Ht 5\' 8"  (1.727 m)  Wt 127.007 kg (280 lb)  BMI 42.58 kg/m2  SpO2 97% Gen: NAD, alert and oriented x 4.  Wife is bedside and helps with history. HEENT: PEERLA, EOMI, Neck: supple, no JVD or thyromegaly Chest: CTA bilaterally, no wheezes, crackles, or other adventitious sounds CV: RRR, no m/g/c/r Abd: soft, obese, mild tenderness in LUQ with deep palpation, ND, +BS in all four quadrants; no HSM, guarding, ridigity, or rebound tenderness Ext: no edema, well perfused with 2+ pulses, Skin: no rash or lesions noted Lymph: no LAD  Data: Lab Results  Component Value Date   WBC 8.7 03/27/2015   HGB 7.1* 03/27/2015   HCT 21.2* 03/27/2015   MCV 77.5* 03/27/2015   PLT 193 03/27/2015    Recent Labs Lab 03/26/15 1626 03/27/15 0526 03/27/15 1254  HGB 9.5* 7.9* 7.1*   Lab Results  Component Value Date   NA 136 03/27/2015   K 3.8 03/27/2015   CL 107 03/27/2015   CO2 26 03/27/2015   BUN 14 03/27/2015   CREATININE 0.86 03/27/2015   Lab Results  Component  Value Date   ALT 27 03/26/2015   AST 29 03/26/2015   ALKPHOS 54 03/26/2015   BILITOT 0.3 03/26/2015   No results for input(s): APTT, INR, PTT in the last 168 hours.    Assessment/Plan: Timothy Bartlett is a 45 y.o. male with multiple episodes of rectal bleeding.  Hgb/MCV on admission 9.6/78.5, currently 7.1./77.5 BUN/creatinine WNL.  Bleeding scan was negative, however, last episode of bleeding was at 8am and study was completed at 2pm.  Recommendations: We recommend to continue to transfuse as needed, serial CBC's.  Please call Dr. Mechele CollinElliott with next hgb level.  Continue clear liquids at this time.  We will continue to follow with you.  Thank you for the consult. Please call with questions or concerns.  Carney Harder, PA-C  I personally performed these services.

## 2015-03-27 NOTE — Progress Notes (Signed)
Inpatient Diabetes Program Recommendations  AACE/ADA: New Consensus Statement on Inpatient Glycemic Control (2015)  Target Ranges:  Prepandial:   less than 140 mg/dL      Peak postprandial:   less than 180 mg/dL (1-2 hours)      Critically ill patients:  140 - 180 mg/dL   Review of Glycemic Control  Results for Timothy Bartlett, Timothy Bartlett (MRN [id number]) as of 03/27/2015 10:16  Ref. Range 03/26/2015 19:34 03/26/2015 21:15 03/27/2015 07:39  Glucose-Capillary Latest Ref Range: 65-99 mg/dL 191264 (H) 478269 (H) 295182 (H)   Diabetes history: Type 2 Outpatient Diabetes medications: Glipizide 5mg  bid, Metformin 1000mg  bid Current orders for Inpatient glycemic control: Novolog 0-9 units tid  Inpatient Diabetes Program Recommendations: fasting blood sugar 182mg /dl, please consider starting basal insulin, Lantus 13 units qhs (0.1unit/kg).  Consider starting hs Novolog 0-5 units qhs  Susette RacerJulie Cherine Drumgoole, RN, [place], [place], CDE Diabetes Coordinator Inpatient Diabetes Program  [phone] (Team Pager) [phone] [facility] Office) 03/27/2015 10:20 AM

## 2015-03-27 NOTE — Consult Note (Signed)
See Sung Amabileari Richards full note.  Pt with lower GI bleeding likely from diverticulosis.  No bleeding in last 12 hours.  Abd mildly tender in LLQ. I discussed plans with him for management and observation for now as it is very likely the bleeding has stopped and about 80% chance it will remain stopped.  Will follow with you.

## 2015-03-28 LAB — TYPE AND SCREEN
ABO/RH(D): A POS
ANTIBODY SCREEN: NEGATIVE
UNIT DIVISION: 0
UNIT DIVISION: 0
Unit division: 0
Unit division: 0

## 2015-03-28 LAB — CBC
HEMATOCRIT: 24.1 % — AB (ref 40.0–52.0)
HEMATOCRIT: 23.6 % — AB (ref 40.0–52.0)
HEMOGLOBIN: 8.2 g/dL — AB (ref 13.0–18.0)
HEMOGLOBIN: 7.9 g/dL — AB (ref 13.0–18.0)
MCH: 26.9 pg (ref 26.0–34.0)
MCH: 26.5 pg (ref 26.0–34.0)
MCHC: 33.9 g/dL (ref 32.0–36.0)
MCHC: 33.6 g/dL (ref 32.0–36.0)
MCV: 79.5 fL — ABNORMAL LOW (ref 80.0–100.0)
MCV: 79 fL — AB (ref 80.0–100.0)
PLATELETS: 147 10*3/uL — AB (ref 150–440)
Platelets: 146 10*3/uL — ABNORMAL LOW (ref 150–440)
RBC: 3.03 MIL/uL — AB (ref 4.40–5.90)
RBC: 2.99 MIL/uL — AB (ref 4.40–5.90)
RDW: 14 % (ref 11.5–14.5)
RDW: 14.5 % (ref 11.5–14.5)
WBC: 6 10*3/uL (ref 3.8–10.6)
WBC: 6.3 10*3/uL (ref 3.8–10.6)

## 2015-03-28 LAB — BASIC METABOLIC PANEL
ANION GAP: 5 (ref 5–15)
BUN: 10 mg/dL (ref 6–20)
CHLORIDE: 107 mmol/L (ref 101–111)
CO2: 25 mmol/L (ref 22–32)
CREATININE: 0.71 mg/dL (ref 0.61–1.24)
Calcium: 7.5 mg/dL — ABNORMAL LOW (ref 8.9–10.3)
GFR calc non Af Amer: >60 mL/min (ref >60)
Glucose, Bld: 229 mg/dL — ABNORMAL HIGH (ref 65–99)
POTASSIUM: 3.9 mmol/L (ref 3.5–5.1)
SODIUM: 137 mmol/L (ref 135–145)

## 2015-03-28 LAB — GLUCOSE, CAPILLARY
GLUCOSE-CAPILLARY: 200 mg/dL — AB (ref 65–99)
GLUCOSE-CAPILLARY: 199 mg/dL — AB (ref 65–99)
Glucose-Capillary: 199 mg/dL — ABNORMAL HIGH (ref 65–99)
Glucose-Capillary: 200 mg/dL — ABNORMAL HIGH (ref 65–99)

## 2015-03-28 MED ORDER — PANTOPRAZOLE SODIUM 40 MG PO TBEC
40.0000 mg | DELAYED_RELEASE_TABLET | Freq: Every day | ORAL | Status: DC
  Administered 2015-03-29: 40 mg via ORAL
  Filled 2015-03-28: qty 1

## 2015-03-28 NOTE — Progress Notes (Signed)
IV bleeding and occluded per Pt moving arm about. Tried to start IV without success, pt would prefer not to be stuck anymore. Pt. On IV protonix and due for DC in the AM if Hem stable. Pt. on soft diet and taking nightly po meds. MD called and updated on the situation. IV protonix DC'ed and PO protonix ordered by MD.

## 2015-03-28 NOTE — Progress Notes (Signed)
[facility] [place] [facility]   PATIENT NAME: Timothy Barrackimothy Dass    MR#:  [id number]  DATE OF BIRTH:  01/19/1971  SUBJECTIVE:  CHIEF COMPLAINT:   Chief Complaint  Patient presents with  . Rectal Bleeding   No further bleeding. No further abdominal pain. Tolerating clear diet. Bleeding scan was negative.  REVIEW OF SYSTEMS:   Review of Systems  Constitutional: Positive for malaise/fatigue. Negative for fever.  Respiratory: Negative for shortness of breath.   Cardiovascular: Negative for chest pain and palpitations.  Gastrointestinal: Positive for abdominal pain and blood in stool. Negative for nausea and vomiting.  Genitourinary: Negative for dysuria.  Neurological: Positive for dizziness and weakness.    DRUG ALLERGIES:   Allergies  Allergen Reactions  . Toradol [Ketorolac Tromethamine] Swelling    VITALS:  Blood pressure 147/67, pulse 84, temperature 98.4 F (36.9 C), temperature source Oral, resp. rate 20, height 5\' 8"  (1.727 m), weight 127.007 kg (280 lb), SpO2 95 %.  PHYSICAL EXAMINATION:  GENERAL:  45 y.o.-year-old patient lying in the bed with no acute distress. EYES: Pupils equal, round, reactive to light and accommodation. No scleral icterus. Extraocular muscles intact.  HEENT: Head atraumatic, normocephalic. Oropharynx and nasopharynx clear.  NECK:  Supple, no jugular venous distention. No thyroid enlargement, no tenderness.  LUNGS: Normal breath sounds bilaterally, no wheezing, rales,rhonchi or crepitation. No use of accessory muscles of respiration.  CARDIOVASCULAR: S1, S2 normal. No murmurs, rubs, or gallops.  ABDOMEN: Soft, slightly, nondistended. Bowel sounds present. No organomegaly or mass.  EXTREMITIES: No pedal edema, cyanosis, or clubbing. Pulses 2+ NEUROLOGIC: Cranial nerves II through XII are intact. Muscle strength 5/5 in all extremities. Sensation intact. Gait not checked.  PSYCHIATRIC: The patient is alert and  oriented x 3. Anxious SKIN: No obvious rash, lesion, or ulcer.    LABORATORY PANEL:   CBC  Recent Labs Lab 03/28/15 1222  WBC 6.3  HGB 7.9*  HCT 23.6*  PLT 147*   ------------------------------------------------------------------------------------------------------------------  Chemistries   Recent Labs Lab 03/26/15 1538  03/28/15 0521  NA 131*  < > 137  K 4.4  < > 3.9  CL 99*  < > 107  CO2 22  < > 25  GLUCOSE 345*  < > 229*  BUN 17  < > 10  CREATININE 1.06  < > 0.71  CALCIUM 8.3*  < > 7.5*  AST 29  --   --   ALT 27  --   --   ALKPHOS 54  --   --   BILITOT 0.3  --   --   < > = values in this interval not displayed. ------------------------------------------------------------------------------------------------------------------  Cardiac Enzymes No results for input(s): TROPONINI in the last 168 hours. ------------------------------------------------------------------------------------------------------------------  RADIOLOGY:  Nm Gi Blood Loss  03/27/2015  CLINICAL DATA:  Multiple bleeding episodes in the past 48 hours. Last bleed 8 a.m. this morning with similar episode 4-6 years ago. EGD and colonoscopy incomplete. EXAM: NUCLEAR MEDICINE GASTROINTESTINAL BLEEDING SCAN TECHNIQUE: Sequential abdominal images were obtained following intravenous administration of Tc-7145m labeled red blood cells. Imaging was carried out to 2 hours. RADIOPHARMACEUTICALS:  19.8 mCi Tc-6445m in-vitro labeled red cells. COMPARISON:  None. FINDINGS: Examination demonstrates normal by distribution of radiotracer uptake over the abdomen/ pelvis. There is no focal increasing accumulation of radiotracer activity with propagation over the expected course of the large or small bowel to suggest GI bleed. IMPRESSION: Normal GI bleeding scan without evidence of active bleed. Electronically Signed  By: Elberta Fortis M.D.   On: 03/27/2015 17:01    EKG:  No orders found for this or any previous  visit.  ASSESSMENT AND PLAN:   1. Acute GI bleeding - Tag red blood skin negative - Gastroenterology consultation appreciated, no endoscopy is planned - Continue Protonix twice a day, nothing by mouth - Bleeding today. This is likely diverticular bleeding.  2. Acute posthemorrhagic anemia - Presenting hemoglobin 9.6, decreased to 7.1. Transfuse 2 units packed red blood cells on 1/5 - Blood pressure stable  not tachycardic - Hemoglobin has dropped slightly overnight. We'll continue to monitor. If remains stable anticipate discharge in the morning  3. Hypertension - blood pressure controlled, though low. Holding antihypertensives in case of further bleeding  4. Diabetes mellitus - Patient nothing by mouth. Continue sliding scale  All the records are reviewed and case discussed with Care Management/Social Workerr. Management plans discussed with the patient, family and they are in agreement. Care plan discussed with the patient and his wife at the bedside  CODE STATUS: Full  TOTAL TIME TAKING CARE OF THIS PATIENT: 35 minutes.  Greater than 50% of time spent in care coordination and counseling. POSSIBLE D/C IN 3 DAYS, DEPENDING ON CLINICAL CONDITION.   Elby Showers M.D on 03/28/2015 at 4:04 PM  Between 7am to 6pm - Pager - [phone]  After 6pm go to [email or web address] - password [email or web address] [facility]  [facility]  Office  [phone]  CC: Primary care physician; No primary care provider on file.

## 2015-03-28 NOTE — Consult Note (Signed)
Patient reports no bleeding today.  No abd pain, likely had bleeding from diverticulosis which has stopped.  Recommend discharge tomorrow if no further bleeding and hgb stable.

## 2015-03-28 NOTE — Progress Notes (Signed)
Inpatient Diabetes Program Recommendations  AACE/ADA: New Consensus Statement on Inpatient Glycemic Control (2015)  Target Ranges:  Prepandial:   less than 140 mg/dL      Peak postprandial:   less than 180 mg/dL (1-2 hours)      Critically ill patients:  140 - 180 mg/dL   Review of Glycemic Control  Results for Timothy Bartlett, Timothy Bartlett (MRN [id number]) as of 03/28/2015 09:04  Ref. Range 03/27/2015 07:39 03/27/2015 11:32 03/27/2015 17:31 03/27/2015 21:58 03/28/2015 07:35  Glucose-Capillary Latest Ref Range: 65-99 mg/dL 409182 (H) 811230 (H) 914190 (H) 252 (H) 199 (H)    Diabetes history: Type 2 Outpatient Diabetes medications: Glipizide 5mg  bid, Metformin 1000mg  bid Current orders for Inpatient glycemic control: Novolog 0-9 units tid  Inpatient Diabetes Program Recommendations: fasting blood sugar 199mg /dl, please consider starting low dose basal insulin, Lantus 13 units qhs (0.1unit/kg).  Consider starting hs Novolog 0-5 units qhs  Timothy RacerJulie Tylea Hise, RN, [place], [place], CDE Diabetes Coordinator Inpatient Diabetes Program  [phone] (Team Pager) [phone] [facility] Office) 03/28/2015 7:52 AM

## 2015-03-29 LAB — CBC
HCT: 22.5 % — ABNORMAL LOW (ref 40.0–52.0)
Hemoglobin: 7.6 g/dL — ABNORMAL LOW (ref 13.0–18.0)
MCH: 27.3 pg (ref 26.0–34.0)
MCHC: 33.6 g/dL (ref 32.0–36.0)
MCV: 81.2 fL (ref 80.0–100.0)
PLATELETS: 153 10*3/uL (ref 150–440)
RBC: 2.77 MIL/uL — AB (ref 4.40–5.90)
RDW: 14.9 % — AB (ref 11.5–14.5)
WBC: 6.1 10*3/uL (ref 3.8–10.6)

## 2015-03-29 LAB — GLUCOSE, CAPILLARY
GLUCOSE-CAPILLARY: 225 mg/dL — AB (ref 65–99)
Glucose-Capillary: 215 mg/dL — ABNORMAL HIGH (ref 65–99)

## 2015-03-29 MED ORDER — PANTOPRAZOLE SODIUM 40 MG PO TBEC: 40.0000 mg | DELAYED_RELEASE_TABLET | Freq: Every day | ORAL | Status: AC

## 2015-03-29 MED ORDER — FERROUS SULFATE 325 (65 FE) MG PO TBEC: 325.0000 mg | DELAYED_RELEASE_TABLET | Freq: Three times a day (TID) | ORAL | Status: AC

## 2015-03-29 MED ORDER — ONDANSETRON HCL 4 MG PO TABS: 4.0000 mg | ORAL_TABLET | Freq: Four times a day (QID) | ORAL | Status: AC | PRN

## 2015-03-29 NOTE — Discharge Summary (Signed)
[facility] [place] [facility]   PATIENT NAME: Timothy Bartlett    MR#:  [id number]  DATE OF BIRTH:  December 16, 1970  DATE OF ADMISSION:  03/26/2015 ADMITTING PHYSICIAN: Auburn BilberryShreyang Patel, MD  DATE OF DISCHARGE:03/29/2015  PRIMARY CARE PHYSICIAN: No primary care provider on file.    ADMISSION DIAGNOSIS:  Gastrointestinal hemorrhage, unspecified gastritis, unspecified gastrointestinal hemorrhage type [K92.2]  DISCHARGE DIAGNOSIS:  Active Problems:   GI bleed   SECONDARY DIAGNOSIS:   Past Medical History  Diagnosis Date  . Diabetes mellitus without complication (HCC)   . Hypertension   . Gout   . GI bleed   . Diverticulitis     HOSPITAL COURSE:   1. Acute GI bleeding - Tag red blood scan negative - Gastroenterology consultation appreciated, no endoscopy is planned - Continue Protonix twice a day, nothing by mouth - BM with old clotted Bleeding today. This was likely diverticular bleeding.  2. Acute posthemorrhagic anemia - Presenting hemoglobin 9.6, decreased to 7.1. Transfuse 2 units packed red blood cells on 1/5 - Blood pressure stable not tachycardic - Hemoglobin has dropped slightly overnight. It is due to dilution. - advised to follow with PMD in office in 1-2 weeks and with GI clinic in 1 month.  3. Hypertension - blood pressure controlled,resume home meds.  4. Diabetes mellitus - resume home emds.   DISCHARGE CONDITIONS:   Stable.  CONSULTS OBTAINED:  Treatment Team:  Scot Junobert T Elliott, MD  DRUG ALLERGIES:   Allergies  Allergen Reactions  . Toradol [Ketorolac Tromethamine] Swelling    DISCHARGE MEDICATIONS:   Current Discharge Medication List    START taking these medications   Details  ferrous sulfate 325 (65 FE) MG EC tablet Take 1 tablet (325 mg total) by mouth 3 (three) times daily with meals. Qty: 100 tablet, Refills: 3    ondansetron (ZOFRAN) 4 MG tablet Take 1 tablet (4 mg total) by mouth every 6 (six) hours  as needed for nausea. Qty: 20 tablet, Refills: 0    pantoprazole (PROTONIX) 40 MG tablet Take 1 tablet (40 mg total) by mouth daily before breakfast. Qty: 30 tablet, Refills: 0      CONTINUE these medications which have NOT CHANGED   Details  allopurinol (ZYLOPRIM) 300 MG tablet Take 300 mg by mouth daily.    citalopram (CELEXA) 40 MG tablet Take 40 mg by mouth daily.    glipiZIDE (GLUCOTROL) 5 MG tablet Take 5 mg by mouth 2 (two) times daily before a meal.    metFORMIN (GLUMETZA) 1000 MG (MOD) 24 hr tablet Take 1,000 mg by mouth 2 (two) times daily with a meal.    metoprolol (LOPRESSOR) 50 MG tablet Take 50 mg by mouth 2 (two) times daily.     temazepam (RESTORIL) 30 MG capsule Take 30 mg by mouth at bedtime as needed for sleep.    zolpidem (AMBIEN) 10 MG tablet Take 10 mg by mouth at bedtime as needed for sleep.      STOP taking these medications     sulfamethoxazole-trimethoprim (BACTRIM DS,SEPTRA DS) 800-160 MG tablet          DISCHARGE INSTRUCTIONS:    folow with GI clinic in 1 month.  If you experience worsening of your admission symptoms, develop shortness of breath, life threatening emergency, suicidal or homicidal thoughts you must seek medical attention immediately by calling 911 or calling your MD immediately  if symptoms less severe.  You Must read complete instructions/literature along with all the possible  adverse reactions/side effects for all the Medicines you take and that have been prescribed to you. Take any new Medicines after you have completely understood and accept all the possible adverse reactions/side effects.   Please note  You were cared for by a hospitalist during your hospital stay. If you have any questions about your discharge medications or the care you received while you were in the hospital after you are discharged, you can call the unit and asked to speak with the hospitalist on call if the hospitalist that took care of you is not  available. Once you are discharged, your primary care physician will handle any further medical issues. Please note that NO REFILLS for any discharge medications will be authorized once you are discharged, as it is imperative that you return to your primary care physician (or establish a relationship with a primary care physician if you do not have one) for your aftercare needs so that they can reassess your need for medications and monitor your lab values.    Today   CHIEF COMPLAINT:   Chief Complaint  Patient presents with  . Rectal Bleeding    HISTORY OF PRESENT ILLNESS:  Timothy Bartlett  is a 45 y.o. male with a known history of GI bleed in the past, diabetes 2, gout presents with bright red blood per rectum and passing clots. Patient reports that he had similar type presentation 4-6 years ago during that time he had the evaluation including an EGD and a colonoscopy which showed multiple polyps as well as diverticulosis. He had a capsule endoscopy which was incomplete. Patient since yesterday has had more than 15 episodes of bloody bowel movements with bright red blood mixed with dark colored blood.. He had a large episode in the ER. He was feeling dizzy earlier. Denies any chest pain or shortness of breath currently. Feels very weak. 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   VITAL SIGNS:  Blood pressure 152/68, pulse 97, temperature 98.4 F (36.9 C), temperature source Oral, resp. rate 18, height 5\' 8"  (1.727 m), weight 127.007 kg (280 lb), SpO2 100 %.  I/O:   Intake/Output Summary (Last 24 hours) at 03/29/15 1436 Last data filed at 03/29/15 1303  Gross per 24 hour  Intake   1160 ml  Output      0 ml  Net   1160 ml    PHYSICAL EXAMINATION:   GENERAL: 45 y.o.-year-old patient lying in the bed with no acute distress. EYES: Pupils equal, round, reactive to light and accommodation. No scleral icterus. Extraocular muscles intact.  HEENT:  Head atraumatic, normocephalic. Oropharynx and nasopharynx clear.  NECK: Supple, no jugular venous distention. No thyroid enlargement, no tenderness.  LUNGS: Normal breath sounds bilaterally, no wheezing, rales,rhonchi or crepitation. No use of accessory muscles of respiration.  CARDIOVASCULAR: S1, S2 normal. No murmurs, rubs, or gallops.  ABDOMEN: Soft, slightly, nondistended. Bowel sounds present. No organomegaly or mass.  EXTREMITIES: No pedal edema, cyanosis, or clubbing. Pulses 2+ NEUROLOGIC: Cranial nerves II through XII are intact. Muscle strength 5/5 in all extremities. Sensation intact. Gait not checked.  PSYCHIATRIC: The patient is alert and oriented x 3. Anxious SKIN: No obvious rash, lesion, or ulcer.   DATA REVIEW:   CBC  Recent Labs Lab 03/29/15 0358  WBC 6.1  HGB 7.6*  HCT 22.5*  PLT 153    Chemistries   Recent Labs Lab 03/26/15 1538  03/28/15 0521  NA 131*  < >  137  K 4.4  < > 3.9  CL 99*  < > 107  CO2 22  < > 25  GLUCOSE 345*  < > 229*  BUN 17  < > 10  CREATININE 1.06  < > 0.71  CALCIUM 8.3*  < > 7.5*  AST 29  --   --   ALT 27  --   --   ALKPHOS 54  --   --   BILITOT 0.3  --   --   < > = values in this interval not displayed.  Cardiac Enzymes No results for input(s): TROPONINI in the last 168 hours.  Microbiology Results  No results found for this or any previous visit.  RADIOLOGY:  Nm Gi Blood Loss  03/27/2015  CLINICAL DATA:  Multiple bleeding episodes in the past 48 hours. Last bleed 8 a.m. this morning with similar episode 4-6 years ago. EGD and colonoscopy incomplete. EXAM: NUCLEAR MEDICINE GASTROINTESTINAL BLEEDING SCAN TECHNIQUE: Sequential abdominal images were obtained following intravenous administration of Tc-79m labeled red blood cells. Imaging was carried out to 2 hours. RADIOPHARMACEUTICALS:  19.8 mCi Tc-81m in-vitro labeled red cells. COMPARISON:  None. FINDINGS: Examination demonstrates normal by distribution of radiotracer  uptake over the abdomen/ pelvis. There is no focal increasing accumulation of radiotracer activity with propagation over the expected course of the large or small bowel to suggest GI bleed. IMPRESSION: Normal GI bleeding scan without evidence of active bleed. Electronically Signed   By: Elberta Fortis M.D.   On: 03/27/2015 17:01    EKG:  No orders found for this or any previous visit.    Management plans discussed with the patient, family and they are in agreement.  CODE STATUS:     Code Status Orders        Start     Ordered   03/26/15 2034  Full code   Continuous     03/26/15 2033      TOTAL TIME TAKING CARE OF THIS PATIENT: 35 minutes.    Altamese Dilling M.D on 03/29/2015 at 2:36 PM  Between 7am to 6pm - Pager - [phone]  After 6pm go to [email or web address] - password [email or web address] [facility]  [facility]  Office  [phone]  CC: Primary care physician; No primary care provider on file.   Note: This dictation was prepared with Dragon dictation along with smaller phrase technology. Any transcriptional errors that result from this process are unintentional.

## 2015-03-29 NOTE — Progress Notes (Signed)
Pt stable. IV removed on prior shift. D/c instructions given and education provided. Prescriptions verified and given. Pt states he understands instructions. Pt dressed and escorted out by staff. Driven home by family.

## 2017-08-02 IMAGING — NM NM GI BLOOD LOSS
2 series · 12 of 12 positions shown · non-contrast
Comparison: None.

CLINICAL DATA: Multiple bleeding episodes in the past 48 hours.
Last bleed 8 a.m. this morning with similar episode 4-6 years ago.
EGD and colonoscopy incomplete.

EXAM:
NUCLEAR MEDICINE GASTROINTESTINAL BLEEDING SCAN
TECHNIQUE: Sequential abdominal images were obtained following intravenous
administration of Hc-AAm labeled red blood cells. Imaging was
carried [DATE] hours.
RADIOPHARMACEUTICALS:  19.8 mCi Hc-AAm in-vitro labeled red cells.

[Series 1000: 2nd hr gi bleed · 4.80mm/px · 6 of 60 frames shown]
[frame 6/60]
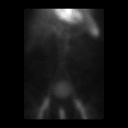
[frame 16/60]
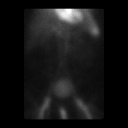
[frame 26/60]
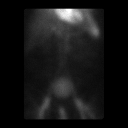
[frame 36/60]
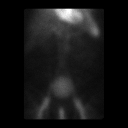
[frame 46/60]
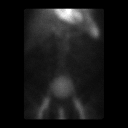
[frame 56/60]
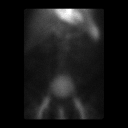

[Series 1000: gi bleed · 4.80mm/px · 6 of 60 frames shown]
[frame 6/60]
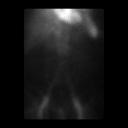
[frame 16/60]
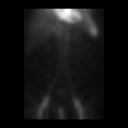
[frame 26/60]
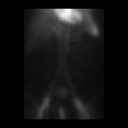
[frame 36/60]
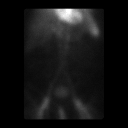
[frame 46/60]
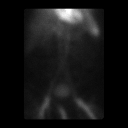
[frame 56/60]
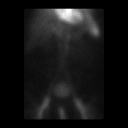

[12 of 12 positions shown; findings below may reference images not displayed]

FINDINGS: Examination demonstrates normal by distribution of radiotracer
uptake over the abdomen/ pelvis. There is no focal increasing
accumulation of radiotracer activity with propagation over the
expected course of the large or small bowel to suggest GI bleed.
IMPRESSION: Normal GI bleeding scan without evidence of active bleed.
